# Patient Record
Sex: Male | Born: 1960 | Race: Black or African American | Hispanic: No | Marital: Married | Smoking: Current some day smoker
Health system: Southern US, Community
[De-identification: ages and names within clinical notes are randomized; demographics above are authoritative.]

## PROBLEM LIST (undated history)

## (undated) DIAGNOSIS — E119 Type 2 diabetes mellitus without complications: Secondary | ICD-10-CM

## (undated) DIAGNOSIS — E785 Hyperlipidemia, unspecified: Secondary | ICD-10-CM

## (undated) DIAGNOSIS — G4730 Sleep apnea, unspecified: Secondary | ICD-10-CM

## (undated) DIAGNOSIS — I10 Essential (primary) hypertension: Secondary | ICD-10-CM

## (undated) HISTORY — DX: Essential (primary) hypertension: I10

## (undated) HISTORY — DX: Sleep apnea, unspecified: G47.30

## (undated) HISTORY — DX: Type 2 diabetes mellitus without complications: E11.9

## (undated) HISTORY — DX: Hyperlipidemia, unspecified: E78.5

## (undated) HISTORY — PX: KNEE SURGERY: SHX244

---

## 2013-02-25 ENCOUNTER — Encounter: Payer: Self-pay | Admitting: Pulmonary Disease

## 2013-02-26 ENCOUNTER — Encounter: Payer: Self-pay | Admitting: Pulmonary Disease

## 2013-02-26 ENCOUNTER — Ambulatory Visit (INDEPENDENT_AMBULATORY_CARE_PROVIDER_SITE_OTHER): Payer: BC Managed Care – PPO | Admitting: Pulmonary Disease

## 2013-02-26 VITALS — BP 136/78 | HR 60 | Temp 98.0°F | Ht 74.0 in | Wt 240.4 lb

## 2013-02-26 DIAGNOSIS — G4733 Obstructive sleep apnea (adult) (pediatric): Secondary | ICD-10-CM

## 2013-02-26 NOTE — Progress Notes (Signed)
Subjective:    Patient ID: Joshua Barker, male    DOB: May 14, 1961, 52 y.o.   MRN: [id number]  HPI  The patient is a 52 year old male who I've been asked to see for obstructive sleep apnea.  He has recently had a home sleep test over 2 nights, and was found to have an AHI of 66 per hour on 91 and 35 per hour on night two.  However, he only had him lie sleep time of 138 minutes on the first night, and 82 minutes on the second night.  The patient states he has intermittent snoring, but no one has ever commented on apnea during the night.  He denies any choking arousal, but does not feel that he is rested in the mornings upon arising.  He denies significant sleep pressure during the day except when he has had to get up early in the mornings.  He does note sleepiness with reading.  He also has issues with sleepiness in the evening watching television, and can actually fall asleep fairly frequently.  He denies any sleepiness driving except on long distances.  Of note, the patient's weight is down 30 pounds over the last few years, and his Epworth score today is 10.  Sleep Questionnaire What time do you typically go to bed?( Between what hours) 11P-1AM 11P-1AM at 0924 on 02/26/13 by Rhunette Croft, CMA How long does it take you to fall asleep? WITHIN MINUTES WITHIN MINUTES at 0924 on 02/26/13 by Rhunette Croft, CMA How many times during the night do you wake up? 2 2 at 0924 on 02/26/13 by Rhunette Croft, CMA What time do you get out of bed to start your day? 0630 0630 at 0924 on 02/26/13 by Rhunette Croft, CMA Do you drive or operate heavy machinery in your occupation? No No at 0924 on 02/26/13 by Rhunette Croft, CMA How much has your weight changed (up or down) over the past two years? (In pounds) 30 lb (13.608 kg)30 lb (13.608 kg) DECREASE at 0924 on 02/26/13 by Rhunette Croft, CMA Have you ever had a sleep study before? Yes Yes at 0924 on 02/26/13 by Rhunette Croft,  CMA If yes, [facility] of study[facility] at 0924 on 02/26/13 by Rhunette Croft, CMA If yes, date of study? 01/21/13 01/21/13 at 0924 on 02/26/13 by Rhunette Croft, CMA Do you currently use CPAP? No No at 0924 on 02/26/13 by Rhunette Croft, CMA Do you wear oxygen at any time? No No at 0924 on 02/26/13 by Rhunette Croft, CMA   Review of Systems  Constitutional: Negative for fever and unexpected weight change.  HENT: Negative for ear pain, nosebleeds, congestion, sore throat, rhinorrhea, sneezing, trouble swallowing, dental problem, postnasal drip and sinus pressure.   Eyes: Negative for redness and itching.  Respiratory: Negative for cough, chest tightness, shortness of breath and wheezing.   Cardiovascular: Negative for palpitations and leg swelling.  Gastrointestinal: Negative for nausea and vomiting.       Acid heartburn  Genitourinary: Negative for dysuria.  Musculoskeletal: Negative for joint swelling.  Skin: Negative for rash.  Neurological: Negative for headaches.  Hematological: Does not bruise/bleed easily.  Psychiatric/Behavioral: Negative for dysphoric mood. The patient is not nervous/anxious.        Objective:   Physical Exam Constitutional:  Well developed, no acute distress  HENT:  Nares patent without discharge  Oropharynx without exudate, palate and uvula are moderately elongated.  Eyes:  Perrla, eomi, no scleral icterus  Neck:  No JVD, no TMG  Cardiovascular:  Normal rate, regular rhythm, no rubs or gallops.  No murmurs        Intact distal pulses  Pulmonary :  Normal breath sounds, no stridor or respiratory distress   No rales, rhonchi, or wheezing  Abdominal:  Soft, nondistended, bowel sounds present.  No tenderness noted.   Musculoskeletal:  No lower extremity edema noted.  Lymph Nodes:  No cervical lymphadenopathy noted  Skin:  No cyanosis noted  Neurologic:  Alert, appropriate, moves all 4 extremities without  obvious deficit.         Assessment & Plan:

## 2013-02-26 NOTE — Patient Instructions (Addendum)
You need to decide about a trial of weight loss for the next 6mos versus trying cpap while trying to work on the weight reduction.   Please call and let me know what you decide.

## 2013-02-26 NOTE — Assessment & Plan Note (Signed)
The patient has moderate to severe obstructive sleep apnea by history recent sleep test, however he did have a short sleep monitoring time.  I have had a long discussion with him about sleep apnea, including its impacted his quality of life and cardiovascular health.  The patient does not feel that he is overly symptomatic at night or during the day, but does admit that he is not rested in the mornings upon arising.  He has lost 30 pounds, and is committed to losing more.  He could possibly take a conservative path and just work on further weight loss over the next 6 months.  However, if he has not lost significant weight by that time, he should consider more aggressive treatment.  The other option is to treat this aggressively from the start, and I would recommend a trial of CPAP if he wanted to do this.  The patient would like to go home and think about his various options, and will get back with me in a short time

## 2013-08-11 ENCOUNTER — Encounter: Payer: Self-pay | Admitting: Podiatry

## 2013-08-11 ENCOUNTER — Ambulatory Visit (INDEPENDENT_AMBULATORY_CARE_PROVIDER_SITE_OTHER): Payer: BLUE CROSS/BLUE SHIELD | Admitting: Podiatry

## 2013-08-11 VITALS — BP 140/104 | HR 70 | Ht 74.0 in | Wt 213.0 lb

## 2013-08-11 DIAGNOSIS — M2041 Other hammer toe(s) (acquired), right foot: Secondary | ICD-10-CM | POA: Insufficient documentation

## 2013-08-11 DIAGNOSIS — M79674 Pain in right toe(s): Secondary | ICD-10-CM

## 2013-08-11 DIAGNOSIS — Q828 Other specified congenital malformations of skin: Secondary | ICD-10-CM

## 2013-08-11 DIAGNOSIS — M79609 Pain in unspecified limb: Secondary | ICD-10-CM | POA: Diagnosis not present

## 2013-08-11 DIAGNOSIS — M2040 Other hammer toe(s) (acquired), unspecified foot: Secondary | ICD-10-CM | POA: Diagnosis not present

## 2013-08-11 DIAGNOSIS — L84 Corns and callosities: Secondary | ICD-10-CM | POA: Insufficient documentation

## 2013-08-11 NOTE — Patient Instructions (Addendum)
Seen for digital corn on 5th and 4th right at contact surface.  Debride lesions.  May benefit from removal of bone spur from 5th digit at the office under local anesthetics.  Come in with open pair of shoes for the surgery.

## 2013-08-11 NOTE — Progress Notes (Signed)
Patient came in to schedule surgery and another trimming of callus. Digital corn on 5th and 4th right at contact surface are very painful to walk. 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  Assessment: Bone spur distal medial 5th digit right with digital corn on contact surface of 4th and 5th digit right. All other findings are within normal.  Plan: Debrided all lesions.  May benefit from removal of bone spur from 5th digit at the office under local anesthetics.  Patient will come in with open pair of shoes for the surgery.

## 2014-12-31 ENCOUNTER — Ambulatory Visit: Payer: Self-pay | Admitting: Pulmonary Disease

## 2015-12-05 ENCOUNTER — Encounter: Payer: Self-pay | Admitting: Emergency Medicine

## 2015-12-05 ENCOUNTER — Ambulatory Visit (HOSPITAL_BASED_OUTPATIENT_CLINIC_OR_DEPARTMENT_OTHER)
Admission: RE | Admit: 2015-12-05 | Discharge: 2015-12-05 | Disposition: A | Discharge status: Home / Self Care | Payer: Managed Care, Other (non HMO) | Source: Ambulatory Visit | Attending: Family Medicine | Admitting: Family Medicine

## 2015-12-05 ENCOUNTER — Emergency Department (INDEPENDENT_AMBULATORY_CARE_PROVIDER_SITE_OTHER)
Admission: EM | Admit: 2015-12-05 | Discharge: 2015-12-05 | Disposition: A | Discharge status: Home / Self Care | Payer: Managed Care, Other (non HMO) | Source: Home / Self Care | Attending: Family Medicine | Admitting: Family Medicine

## 2015-12-05 DIAGNOSIS — M79671 Pain in right foot: Secondary | ICD-10-CM | POA: Insufficient documentation

## 2015-12-05 DIAGNOSIS — M7989 Other specified soft tissue disorders: Secondary | ICD-10-CM | POA: Diagnosis not present

## 2015-12-05 DIAGNOSIS — IMO0001 Reserved for inherently not codable concepts without codable children: Secondary | ICD-10-CM

## 2015-12-05 DIAGNOSIS — M79674 Pain in right toe(s): Secondary | ICD-10-CM

## 2015-12-05 DIAGNOSIS — R030 Elevated blood-pressure reading, without diagnosis of hypertension: Secondary | ICD-10-CM

## 2015-12-05 MED ORDER — INDOMETHACIN 25 MG PO CAPS: 25.0000 mg | ORAL_CAPSULE | Freq: Three times a day (TID) | ORAL | Status: DC | PRN

## 2015-12-05 NOTE — ED Provider Notes (Signed)
CSN: [id number]     Arrival date & time 12/05/15  1128 History   None    Chief Complaint  Patient presents with  . Foot Pain   (Consider location/radiation/quality/duration/timing/severity/associated sxs/prior Treatment) HPI Pt is a 55yo male presenting to [facility] with c/o Right foot pain and swelling for about 5 days.  Pt reports hx of similar symptoms in Left and Right foot intermittently but states the pain and swelling typically only lasts 1-2 days at a time. He denies known injuries but states he wears insoles in his shoes due to having high arches and does not always remember to transfer them from one pair of shoes to the other.  Pt questions if he has gout but denies hx of gout. Pain is aching, throbbing, burning, 6/10 at this time, worse with palpation and ambulation.  Pt is also concerned for a blood clot as a friend of his died from a blood clot that traveled from his leg after a basketball injury.  Pt denies prior hx of blood clots.   Pt's BP elevated in triage.  He is a diabetic and hx of HTN. He takes all his medications daily and notes his CBG and BP is typically well controlled. His BP  Is elevated today due to taking OTC cough/cold medication recently. Pt states his BP has been better at home since onset of his foot swelling and is not concerned about the high reading today in UC.  Denies chest pain or SOB. Denies headache or dizziness.  Denies taking any fluid pills.    Past Medical History  Diagnosis Date  . DM (diabetes mellitus) (HCC)   . Hypertension   . Hyperlipidemia   . Sleep apnea    Past Surgical History  Procedure Laterality Date  . Knee surgery     Family History  Problem Relation Age of Onset  . Adopted: Yes  . Other      UNKNOWN FAMILY HISTORY D/T BEING ADOPTED   Social History  Substance Use Topics  . Smoking status: Current Some Day Smoker    Types: Cigars  . Smokeless tobacco: Never Used     Comment: only smoke 2 cigars in a month  . Alcohol Use: Yes      Comment: occasional use    Review of Systems  Constitutional: Negative for fever and chills.  Respiratory: Negative for cough, chest tightness and shortness of breath.   Cardiovascular: Positive for leg swelling. Negative for chest pain and palpitations.  Gastrointestinal: Negative for nausea, vomiting, abdominal pain and diarrhea.  Musculoskeletal: Positive for myalgias, joint swelling and arthralgias. Negative for gait problem.       Right foot and ankle pain  Skin: Negative for color change and wound.  Neurological: Negative for dizziness, syncope, light-headedness and headaches.    Allergies  Review of patient's allergies indicates not on file.  Home Medications   Prior to Admission medications   Medication Sig Start Date End Date Taking? Authorizing Provider  insulin aspart protamine - aspart (NOVOLOG 70/30 MIX) (70-30) 100 UNIT/ML FlexPen Inject into the skin 2 (two) times daily.   Yes Historical Provider, MD  amLODipine (NORVASC) 10 MG tablet Take 10 mg by mouth daily.    Historical Provider, MD  aspirin 81 MG tablet Take 81 mg by mouth daily.    Historical Provider, MD  Azilsartan-Chlorthalidone 40-25 MG TABS Take 1 tablet by mouth daily.    Historical Provider, MD  glipiZIDE-metformin (METAGLIP) 5-500 MG per tablet Take 1 tablet by  mouth daily.    Historical Provider, MD  indomethacin (INDOCIN) 25 MG capsule Take 1 capsule (25 mg total) by mouth 3 (three) times daily as needed. 12/05/15   Junius FinnerErin O'Malley, PA-C  Nebivolol HCl (BYSTOLIC) 20 MG TABS Take 1 tablet by mouth daily.    Historical Provider, MD   Meds Ordered and Administered this Visit  Medications - No data to display  BP 207/90 mmHg  Pulse 60  Temp(Src) 98.5 F (36.9 C) (Oral)  Ht 6\' 2"  (1.88 m)  Wt 235 lb (106.595 kg)  BMI 30.16 kg/m2  SpO2 97% No data found.   Physical Exam  Constitutional: He is oriented to person, place, and time. He appears well-developed and well-nourished.  HENT:  Head:  Normocephalic and atraumatic.  Eyes: EOM are normal.  Neck: Normal range of motion.  Cardiovascular: Normal rate and regular rhythm.   Pulses:      Dorsalis pedis pulses are 2+ on the right side.  Pulmonary/Chest: Effort normal. No respiratory distress.  Musculoskeletal: Normal range of motion. He exhibits edema and tenderness.  Right ankle and foot: moderate circumfrential edema to ankle and foot.  Tenderness to medial and lateral malleolus. Mild tenderness to dorsum of foot.  Full ROM ankle and toes Right calf- soft, non-tender. No obvious edema.   Neurological: He is alert and oriented to person, place, and time.  Right foot: normal sensation to light touch  Skin: Skin is warm and dry. No erythema.  Right lower leg, ankle and foot: skin in tact. No ecchymosis, erythema or warmth  Psychiatric: He has a normal mood and affect. His behavior is normal.  Nursing note and vitals reviewed.   ED Course  Procedures (including critical care time)  Labs Review Labs Reviewed - No data to display  Imaging Review [place] Venous Img Lower Unilateral Right  12/05/2015  CLINICAL DATA:  Right foot pain, swelling, redness , radiating up Right lower leg EXAM: RIGHT LOWER EXTREMITY VENOUS DOPPLER ULTRASOUND TECHNIQUE: Gray-scale sonography with compression, as well as color and duplex ultrasound, were performed to evaluate the deep venous system from the level of the common femoral vein through the popliteal and proximal calf veins. COMPARISON:  None FINDINGS: Normal compressibility of the common femoral, superficial femoral, and popliteal veins, as well as the proximal calf veins. No filling defects to suggest DVT on grayscale or color Doppler imaging. Doppler waveforms show normal direction of venous flow, normal respiratory phasicity and response to augmentation. Visualized segments of the saphenous venous system normal in caliber and compressibility. Survey views of the contralateral common femoral vein are  unremarkable. IMPRESSION: 1. No evidence of lower extremity deep vein thrombosis, RIGHT. Electronically Signed   By: Corlis Leak  Hassell M.D.   On: 12/05/2015 13:49      MDM   1. Pain and swelling of toe of right foot   2. Elevated blood pressure    Pt c/o Right foot and ankle pain with swelling.  This has occurred previously but has persisted longer than usual. No known injury. Pt concerned for gout vs blood clot. No prior hx of either.  Right foot: PMS in tact.  No evidence of underlying infect. Low concern for gout given no tenderness with light touch. No erythema or warmth.  [facility]S unavailable at this facility [facility] Pt sent via POV to [facility] for U/S at 13:30 this afternoon. Pt will be notified over phone of results and tx options.  1:59 PM U/S is negative for DVT.  Discussed results with pt over the phone. Encouraged pt to take indomethacin or 600-800mg  ibuprofen every 6-8 hours for pain and swelling. F/u with PCP in 1 week for recheck of symptoms if not improving. Patient verbalized understanding and agreement with treatment plan.   [facility]  Pt reports systolic BP in 160s this morning and attributes elevated BP in UC to taking OTC cough/cold medications as well as being nervous about possible blood clot in foot.  Pt does not want further workup of elevated BP at this time.  Denies CP, SOB, headache or dizziness. Encouraged to f/u with PCP for BP recheck. Pt verbalized understanding.    Junius Finner, PA-C 12/05/15 1400

## 2015-12-05 NOTE — ED Notes (Signed)
Right foot pain x 5 days, chronic problem, he has not been wearing his shoe inserts and his foot started hurting it usually doesn't last this long or this severe.

## 2015-12-05 NOTE — Discharge Instructions (Signed)
Edema °Edema is an abnormal buildup of fluids. It is more common in your legs and thighs. Painless swelling of the feet and ankles is more likely as a person ages. It also is common in looser skin, like around your eyes. °HOME CARE  °· Keep the affected body part above the level of the heart while lying down. °· Do not sit still or stand for a long time. °· Do not put anything right under your knees when you lie down. °· Do not wear tight clothes on your upper legs. °· Exercise your legs to help the puffiness (swelling) go down. °· Wear elastic bandages or support stockings as told by your doctor. °· A low-salt diet may help lessen the puffiness. °· Only take medicine as told by your doctor. °GET HELP IF: °· Treatment is not working. °· You have heart, liver, or kidney disease and notice that your skin looks puffy or shiny. °· You have puffiness in your legs that does not get better when you raise your legs. °· You have sudden weight gain for no reason. °GET HELP RIGHT AWAY IF:  °· You have shortness of breath or chest pain. °· You cannot breathe when you lie down. °· You have pain, redness, or warmth in the areas that are puffy. °· You have heart, liver, or kidney disease and get edema all of a sudden. °· You have a fever and your symptoms get worse all of a sudden. °MAKE SURE YOU:  °· Understand these instructions. °· Will watch your condition. °· Will get help right away if you are not doing well or get worse. °  °This information is not intended to replace advice given to you by your health care provider. Make sure you discuss any questions you have with your health care provider. °  °Document Released: 04/30/2008 Document Revised: 11/17/2013 Document Reviewed: 09/04/2013 °Elsevier Interactive Patient Education ©2016 [employer] ° °

## 2016-03-13 ENCOUNTER — Ambulatory Visit: Payer: BLUE CROSS/BLUE SHIELD | Admitting: Podiatry

## 2016-03-14 ENCOUNTER — Encounter: Payer: Self-pay | Admitting: Podiatry

## 2016-03-14 ENCOUNTER — Ambulatory Visit (INDEPENDENT_AMBULATORY_CARE_PROVIDER_SITE_OTHER): Payer: Managed Care, Other (non HMO) | Admitting: Podiatry

## 2016-03-14 VITALS — BP 161/83 | HR 54

## 2016-03-14 DIAGNOSIS — M79673 Pain in unspecified foot: Secondary | ICD-10-CM | POA: Diagnosis not present

## 2016-03-14 DIAGNOSIS — M79604 Pain in right leg: Secondary | ICD-10-CM

## 2016-03-14 DIAGNOSIS — M21969 Unspecified acquired deformity of unspecified lower leg: Secondary | ICD-10-CM

## 2016-03-14 DIAGNOSIS — M216X9 Other acquired deformities of unspecified foot: Secondary | ICD-10-CM

## 2016-03-14 DIAGNOSIS — M216X1 Other acquired deformities of right foot: Secondary | ICD-10-CM

## 2016-03-14 DIAGNOSIS — M659 Synovitis and tenosynovitis, unspecified: Secondary | ICD-10-CM

## 2016-03-14 DIAGNOSIS — M216X2 Other acquired deformities of left foot: Secondary | ICD-10-CM

## 2016-03-14 NOTE — Patient Instructions (Signed)
Seen for pain in both ankles. Noted of high arched foot with weakened first metatarsal bone and tight Achilles tendon. Need daily stretch exercise and need custom orthotics.  Return for orthotics.

## 2016-03-14 NOTE — Progress Notes (Signed)
Subjective: 55 year old male presents complaining of tenderness on top and bottom on both feet R>L duration of over a month. Pain and swelling started medial aspect of the lateral malleoli and advanced to medial direction on right ankle.  Last month he went to ER for this swollen foot on right. Was treated with NSAIA. Problem subsided after 2 weeks.  Blood sugar this morning was 125. Not on feet much. Office work and drives a lot.  Has had previous orthotics two years ago. They are worn out.  Objective: Neurovascular status are within normal. Dermatologic: Normal skin without abnormal skin lesions.  Orthopedic: High arched Cavus foot, tight Achilles tendon R>L, excess sagittal plane motion first ray bilateral. Pain at anterior portion of lateral malleoli area right foot.   Assessment: Tenosynovitis right ankle. 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 Hypermobile first ray bilateral. Ankle equinus R>L. Pain with ambulation R>L.  Plan: Reviewed clinical findings and available treatment options. Reviewed stretch exercise for tight Achilles tendon. Patient is to practice daily.  Night Splint dispensed with instruction. Return for a new pair of custom orthotics.

## 2016-03-20 ENCOUNTER — Ambulatory Visit: Payer: Managed Care, Other (non HMO) | Admitting: Podiatry

## 2016-06-04 ENCOUNTER — Emergency Department (INDEPENDENT_AMBULATORY_CARE_PROVIDER_SITE_OTHER)
Admission: EM | Admit: 2016-06-04 | Discharge: 2016-06-04 | Disposition: A | Discharge status: Home / Self Care | Payer: Managed Care, Other (non HMO) | Source: Home / Self Care | Attending: Family Medicine | Admitting: Family Medicine

## 2016-06-04 ENCOUNTER — Encounter: Payer: Self-pay | Admitting: *Deleted

## 2016-06-04 DIAGNOSIS — M7021 Olecranon bursitis, right elbow: Secondary | ICD-10-CM

## 2016-06-04 MED ORDER — TRAMADOL HCL 50 MG PO TABS: 50.0000 mg | ORAL_TABLET | Freq: Four times a day (QID) | ORAL | Status: DC | PRN

## 2016-06-04 MED ORDER — CLINDAMYCIN HCL 300 MG PO CAPS: 300.0000 mg | ORAL_CAPSULE | Freq: Three times a day (TID) | ORAL | Status: DC

## 2016-06-04 NOTE — ED Notes (Signed)
Pt c/o RT elbow swelling and pain x last night. Denies injury. He took Advil 400mg  at 0900 today.

## 2016-06-04 NOTE — Discharge Instructions (Signed)
Tramadol is strong pain medication. While taking, do not drink alcohol, drive, or perform any other activities that requires focus while taking these medications.   Please take antibiotics as prescribed and be sure to complete entire course even if you start to feel better to ensure infection does not come back.    Olecranon Bursitis With Rehab A bursa is a fluid-filled sac that is located between soft tissues (ligaments, tendons, skin) and bones. The purpose of a bursa is to allow the soft tissue to function smoothly, without friction. The olecranon bursa is located between the back of the elbow (olecranon) and the skin. Olecranon bursitis involves inflammation of this bursa, resulting in pain. SYMPTOMS   Pain, tenderness, swelling, warmth, or redness over the back of the elbow.  Reduced range of motion of the affected elbow.  Sometimes, severe pain with movement of the affected elbow.  Crackling sound (crepitation) when the bursa is moved or touched.  Often, painless swelling of the bursa.  Fever (when infected). CAUSES  Olecranon bursitis is often caused by direct hit (trauma) to the elbow. Less commonly, it is due to overuse and/or strenuous exercise that the elbow is not used to. RISK INCREASES WITH:  Sports that require bending or landing on the elbow (football, volleyball).  Vigorous or repetitive athletic training, or sudden increase or change in activity level (weekend warriors).  Failure to warm up properly before activity.  Poor exercise technique.  Playing on artificial turf. PREVENTION  Avoid injuries and the overuse of muscles whenever possible.  Warm up and stretch properly before activity.  Allow for adequate recovery between workouts.  Maintain physical fitness:  Strength, flexibility, and endurance.  Cardiovascular fitness.  Learn and use proper technique.  Wear properly fitted and padded protective equipment. PROGNOSIS  If treated properly,  olecranon bursitis is usually curable within 2 weeks.  RELATED COMPLICATIONS   Longer healing time, if not properly treated or if not given enough time to heal.  Recurring symptoms that result in a chronic problem.  Joint stiffness with permanent limitation of the affected joint's movement.  Infection of the bursa.  Chronic inflammation or scarring of the bursa. TREATMENT Treatment first involves the use of ice and medicine to reduce pain and inflammation. The use of strengthening and stretching exercises may help reduce pain with activity. These exercises may be performed at home or with a therapist. Elbow pads may be advised to protect the bursa. If symptoms persist despite nonsurgical treatment, a procedure to withdraw fluid from the bursa may be advised. This procedure may be accompanied with an injection of corticosteroids to reduce inflammation. Sometimes, surgery is needed to remove the bursa. MEDICATION  If pain medicine is needed, nonsteroidal anti-inflammatory medicines (aspirin and ibuprofen), or other minor pain relievers (acetaminophen), are often advised.  Do not take pain medicine for 7 days before surgery.  Prescription pain relievers may be given, if your caregiver thinks they are needed. Use only as directed and only as much as you need.  Corticosteroid injections may be given by your caregiver. These injections should be reserved for the most serious cases, because they may only be given a certain number of times. HEAT AND COLD  Cold treatment (icing) should be applied for 10 to 15 minutes every 2 to 3 hours for inflammation and pain, and immediately after activity that aggravates your symptoms. Use ice packs or an ice massage.  Heat treatment may be used before performing stretching and strengthening activities prescribed by your caregiver,  physical therapist, or athletic trainer. Use a heat pack or a warm water soak. SEEK IMMEDIATE MEDICAL CARE IF:   Symptoms get  worse or do not improve in 2 weeks, despite treatment.  Signs of infection develop, including fever of 102 F (38.9 C), increased pain, redness, warmth, or pus draining from the bursa.  New, unexplained symptoms develop. (Drugs used in treatment may produce side effects.) EXERCISES  RANGE OF MOTION (ROM) AND STRETCHING EXERCISES - Olecranon Bursitis These exercises may help you when beginning to rehabilitate your injury. Your symptoms may resolve with or without further involvement from your physician, physical therapist or athletic trainer. While completing these exercises, remember:   Restoring tissue flexibility helps normal motion to return to the joints. This allows healthier, less painful movement and activity.  An effective stretch should be held for at least 30 seconds.  A stretch should never be painful. You should only feel a gentle lengthening or release in the stretched tissue. RANGE OF MOTION - Elbow Flexion, Supine  Lie on your back. Extend your right / left arm into the air, bracing it with your opposite hand. Allow your right / left arm to relax.  Let your elbow bend, allowing your hand to fall slowly toward your chest.  You should feel a gentle stretch along the back of your upper arm and elbow. Your physician, physical therapist or athletic trainer may ask you to hold a __________ hand weight to increase the intensity of this stretch.  Hold for __________ seconds. Slowly return your right / left arm to the upright position. Repeat __________ times. Complete this exercise __________ times per day. STRETCH - Elbow Flexors  Lie on a firm bed or countertop on your back. Be sure that you are in a comfortable position which will allow you to relax your arm muscles.  Place a folded towel under your right / left upper arm, so that your elbow and shoulder are at the same height. Extend your arm; your elbow should not rest on the bed or towel  Allow the weight of your hand to  straighten your elbow. Keep your arm and chest muscles relaxed. Your caregiver may ask you to increase the intensity of your stretch by adding a small wrist or hand weight.  Hold for __________ seconds. You should feel a stretch on the inside of your elbow. Slowly return to the starting position. Repeat __________ times. Complete this exercise __________ times per day. STRENGTHENING EXERCISES - Olecranon Bursitis These exercises will help you regain your strength. They may resolve your symptoms with or without further involvement from your physician, physical therapist or athletic trainer. While completing these exercises, remember:   Muscles can gain both the endurance and the strength needed for everyday activities through controlled exercises.  Complete these exercises as instructed by your physician, physical therapist or athletic trainer. Increase the resistance and repetitions only as guided by your caregiver.  You may experience muscle soreness or fatigue, but the pain or discomfort you are trying to eliminate should never worsen during these exercises. If this pain does worsen, stop and make certain you are following the directions exactly. If the pain is still present after adjustments, discontinue the exercise until you can discuss the trouble with your caregiver. STRENGTH - Elbow Extensors, Isometric  Stand or sit upright on a firm surface. Place your right / left arm so that your palm faces your stomach, and it is at the height of your waist.  Place your opposite hand  on the underside of your forearm. Gently push up as your right / left arm resists. Push as hard as you can with both arms without causing any pain or movement at your right / left elbow. Hold this stationary position for __________ seconds.  Gradually release the tension in both arms. Allow your muscles to relax completely before repeating. Repeat __________ times. Complete this exercise __________ times per  day. STRENGTH - Elbow Flexors, Isometric  Stand or sit upright on a firm surface. Place your right / left arm so that your hand is palm-up and at the height of your waist.  Place your opposite hand on top of your forearm. Gently push down as your right / left arm resists. Push as hard as you can with both arms without causing any pain or movement at your right / left elbow. Hold this stationary position for __________ seconds.  Gradually release the tension in both arms. Allow your muscles to relax completely before repeating. Repeat __________ times. Complete this exercise __________ times per day. STRENGTH - Elbow Flexors, Supinated  With good posture, stand or sit on a firm chair without armrests. Allow your right / left arm to rest at your side with your palm facing forward.  Holding a __________ weight, or gripping a rubber exercise band or tubing,  bring your hand toward your shoulder.  Allow your muscles to control the resistance as your hand returns to your side. Repeat __________ times. Complete this exercise __________ times per day.  STRENGTH - Elbow Flexors, Neutral  With good posture, stand or sit on a firm chair without armrests. Allow your right / left arm to rest at your side with your thumb facing forward.  Holding a __________weight, or gripping a rubber exercise band or tubing,  bring your hand toward your shoulder.  Allow your muscles to control the resistance as your hand returns to your side. Repeat __________ times. Complete this exercise __________ times per day.  STRENGTH - Elbow Extensors  Lie on your back. Extend your right / left elbow into the air, pointing it toward the ceiling. Brace your arm with your opposite hand.*  Holding a __________ weight in your hand, slowly straighten your right / left elbow.  Allow your muscles to control the weight as your hand returns to its starting position. Repeat __________ times. Complete this exercise __________  times per day. *You may also stand with your elbow overhead and pointed toward the ceiling, supported by your opposite hand. STRENGTH - Elbow Extensors, Dynamic  With good posture, stand, or sit on a firm chair without armrests. Keeping your upper arms at your side, bring both hands up to your right / left shoulder while gripping a rubber exercise band or tubing. Your right / left hand should be just below the other hand.  Straighten your right / left elbow. Hold for __________ seconds.  Allow your muscles to control the rubber exercise band, as your hand returns to your shoulder. Repeat __________ times. Complete this exercise __________ times per day.   This information is not intended to replace advice given to you by your health care provider. Make sure you discuss any questions you have with your health care provider.   Document Released: 11/12/2005 Document Revised: 03/29/2015 Document Reviewed: 02/24/2009 Else[employer] Interactive Patient Education [employer]

## 2016-06-04 NOTE — ED Provider Notes (Signed)
CSN: [id number]     Arrival date & time 06/04/16  1956 History   First MD Initiated Contact with Patient 06/04/16 2017     Chief Complaint  Patient presents with  . Joint Swelling   (Consider location/radiation/quality/duration/timing/severity/associated sxs/prior Treatment) HPI  Joshua Barker is a 55 y.o. male presenting to UC with c/o sudden onset, suddenly worsening Right elbow swelling and pain that started last night.  Pain is aching and sore, 7/10, worse with movement of elbow and palpation of swollen area. He took  Advil this morning around 9AM with minimal relief. Denies known injury. No hx of similar symptoms. Pt is Right hand dominant, but does not recall any specific repetitive movements with that arm.    Past Medical History  Diagnosis Date  . DM (diabetes mellitus) (HCC)   . Hypertension   . Hyperlipidemia   . Sleep apnea    Past Surgical History  Procedure Laterality Date  . Knee surgery     Family History  Problem Relation Age of Onset  . Adopted: Yes  . Other      UNKNOWN FAMILY HISTORY D/T BEING ADOPTED   Social History  Substance Use Topics  . Smoking status: Current Some Day Smoker    Types: Cigars  . Smokeless tobacco: Never Used     Comment: only smoke 2 cigars in a month  . Alcohol Use: Yes     Comment: occasional use    Review of Systems  Constitutional: Negative for fever and chills.  Gastrointestinal: Negative for nausea and vomiting.  Musculoskeletal: Positive for myalgias, joint swelling and arthralgias.  Skin: Negative for color change and wound.  Neurological: Negative for weakness and numbness.    Allergies  Review of patient's allergies indicates no known allergies.  Home Medications   Prior to Admission medications   Medication Sig Start Date End Date Taking? Authorizing Provider  TURMERIC PO Take by mouth.   Yes Historical Provider, MD  amLODipine (NORVASC) 10 MG tablet Take 10 mg by mouth daily.    Historical Provider, MD   aspirin 81 MG tablet Take 81 mg by mouth daily.    Historical Provider, MD  Azilsartan-Chlorthalidone 40-25 MG TABS Take 1 tablet by mouth daily.    Historical Provider, MD  clindamycin (CLEOCIN) 300 MG capsule Take 1 capsule (300 mg total) by mouth 3 (three) times daily. X 10 days 06/04/16   Junius Finner, PA-C  glipiZIDE-metformin (METAGLIP) 5-500 MG per tablet Take 1 tablet by mouth daily.    Historical Provider, MD  indomethacin (INDOCIN) 25 MG capsule Take 1 capsule (25 mg total) by mouth 3 (three) times daily as needed. 12/05/15   Junius Finner, PA-C  insulin aspart protamine - aspart (NOVOLOG 70/30 MIX) (70-30) 100 UNIT/ML FlexPen Inject into the skin 2 (two) times daily.    Historical Provider, MD  Nebivolol HCl (BYSTOLIC) 20 MG TABS Take 1 tablet by mouth daily.    Historical Provider, MD  traMADol (ULTRAM) 50 MG tablet Take 1 tablet (50 mg total) by mouth every 6 (six) hours as needed. 06/04/16   Junius Finner, PA-C   Meds Ordered and Administered this Visit  Medications - No data to display  BP 106/69 mmHg  Pulse 75  Temp(Src) 98.2 F (36.8 C) (Oral)  Resp 18  Ht  (1.88 m)  Wt 237 lb (107.502 kg)  BMI 30.42 kg/m2  SpO2 96% No data found.   Physical Exam  Constitutional: He is oriented to person, place, and time.  He appears well-developed and well-nourished.  HENT:  Head: Normocephalic and atraumatic.  Eyes: EOM are normal.  Neck: Normal range of motion.  Cardiovascular: Normal rate.   Pulses:      Radial pulses are 2+ on the right side.  Pulmonary/Chest: Effort normal.  Musculoskeletal: Normal range of motion. He exhibits edema and tenderness.  Right elbow: full ROM, moderate edema of olecranon bursa. Tender.   Neurological: He is alert and oriented to person, place, and time.  Skin: Skin is warm and dry. No erythema.  Right elbow: skin in tact. Pt is dark skinned, no obvious erythema or ecchymosis. Mild warmth over olecranon.   Psychiatric: He has a normal mood  and affect. His behavior is normal.  Nursing note and vitals reviewed.   ED Course  Procedures (including critical care time)  Labs Review Labs Reviewed - No data to display  Imaging Review No results found.    MDM   1. Bursitis of elbow, right    Exam c/w olecranon bursitis. Mild warmth. No obvious erythema or ecchymosis, however, due to warmth and severe pain, will cover for underlying infection.  Rx: clindamycin and tramadol. Ace wrap provided in UC, however, advised pt he may find a more comfortable elbow sleeve for compression over the counter.   Home care instructions provided. F/u in 4-5 days with Sports Medicine if not improving, sooner if symptoms continue to worsen. Patient verbalized understanding and agreement with treatment plan.     Junius FinnerErin O'Malley, PA-C 06/05/16 [phone]

## 2017-05-02 DIAGNOSIS — E785 Hyperlipidemia, unspecified: Secondary | ICD-10-CM | POA: Diagnosis not present

## 2017-05-02 DIAGNOSIS — E1122 Type 2 diabetes mellitus with diabetic chronic kidney disease: Secondary | ICD-10-CM | POA: Diagnosis not present

## 2017-05-02 DIAGNOSIS — N183 Chronic kidney disease, stage 3 (moderate): Secondary | ICD-10-CM | POA: Diagnosis not present

## 2017-05-02 DIAGNOSIS — I10 Essential (primary) hypertension: Secondary | ICD-10-CM | POA: Diagnosis not present

## 2017-11-08 IMAGING — US US EXTREM LOW VENOUS*R*
1 series · 14 of 24 positions shown · non-contrast
Comparison: None

CLINICAL DATA: Right foot pain, swelling, redness , radiating up
Right lower leg

EXAM:
RIGHT LOWER EXTREMITY VENOUS DOPPLER ULTRASOUND
TECHNIQUE: Gray-scale sonography with compression, as well as color and duplex
ultrasound, were performed to evaluate the deep venous system from
the level of the common femoral vein through the popliteal and
proximal calf veins.

[Series 1: us extrem low venous*right* · 0.08mm/px · 14 of 28 slices shown]
[im 1/28]
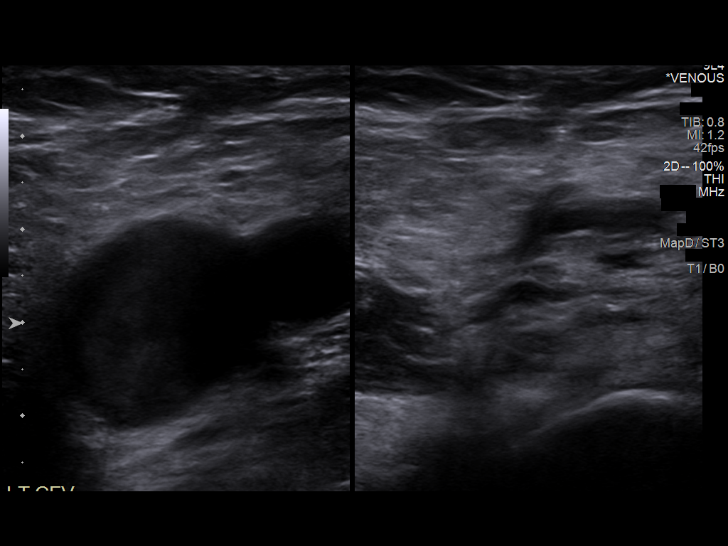
[im 3/28]
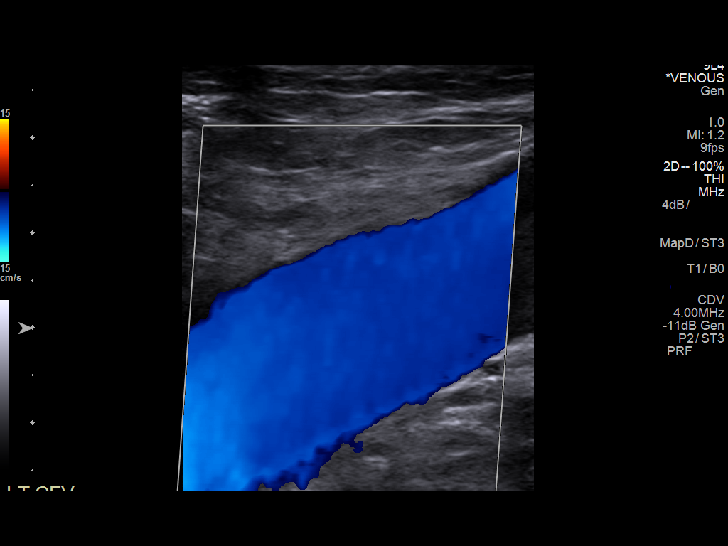
[im 5/28]
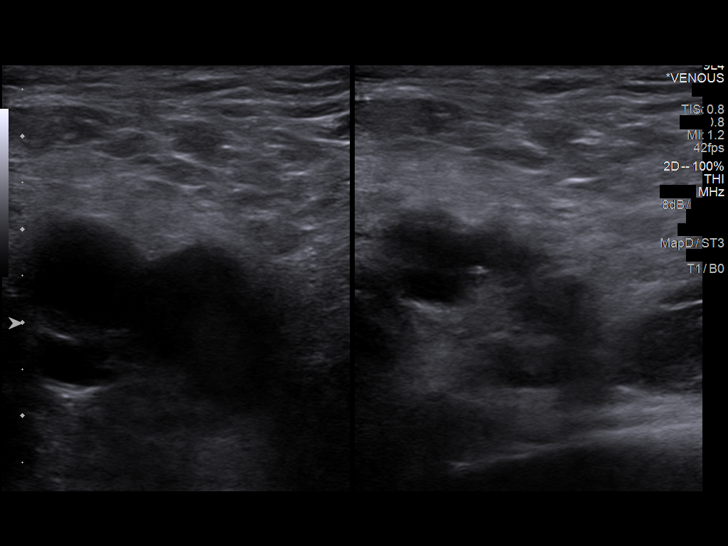
[im 8/28]
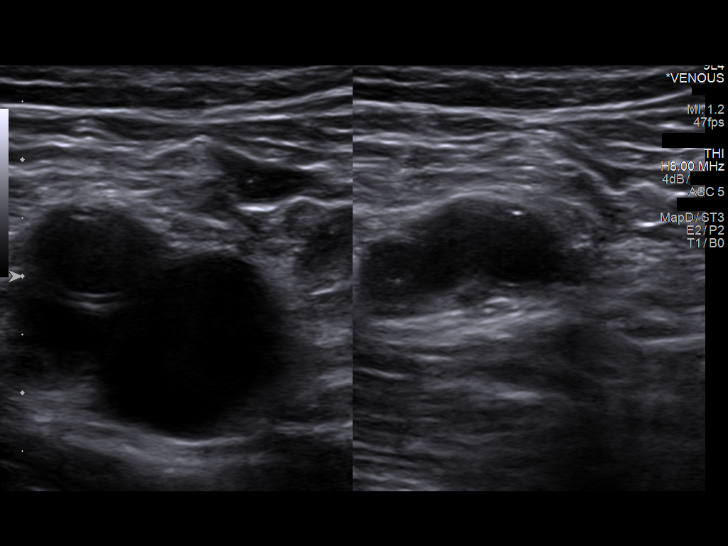
[im 9/28]
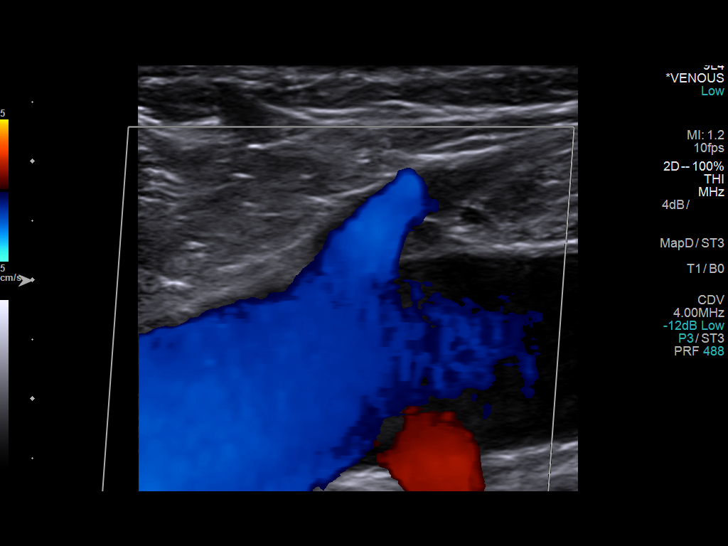
[im 11/28]
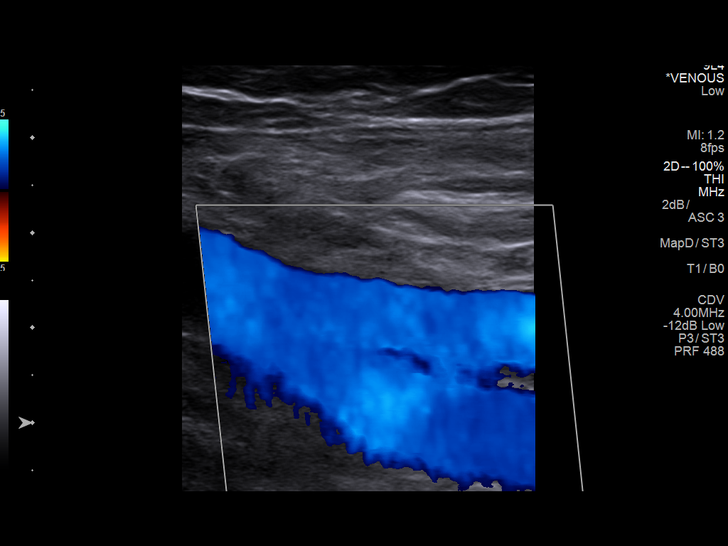
[im 13/28]
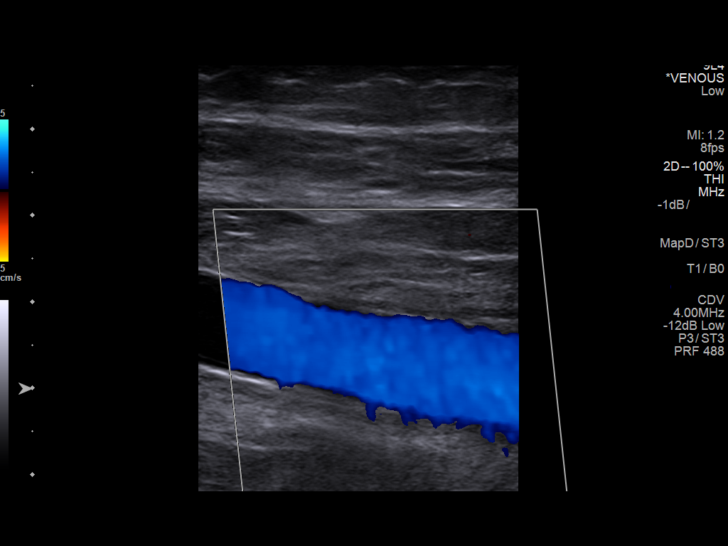
[im 15/28]
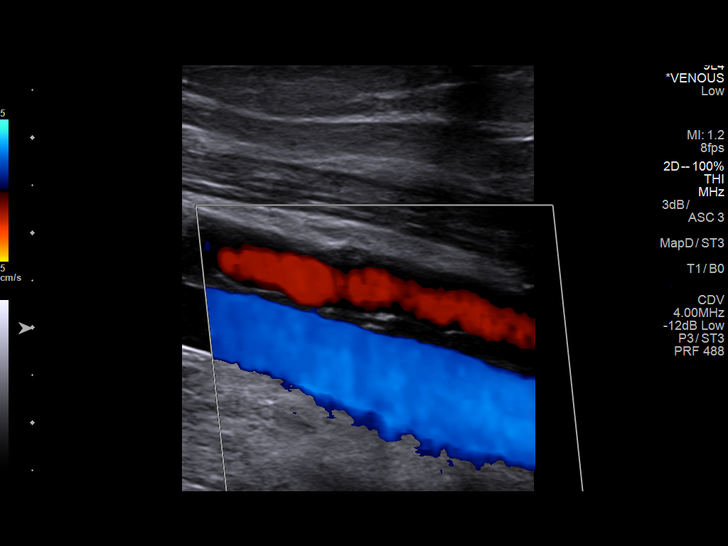
[im 17/28]
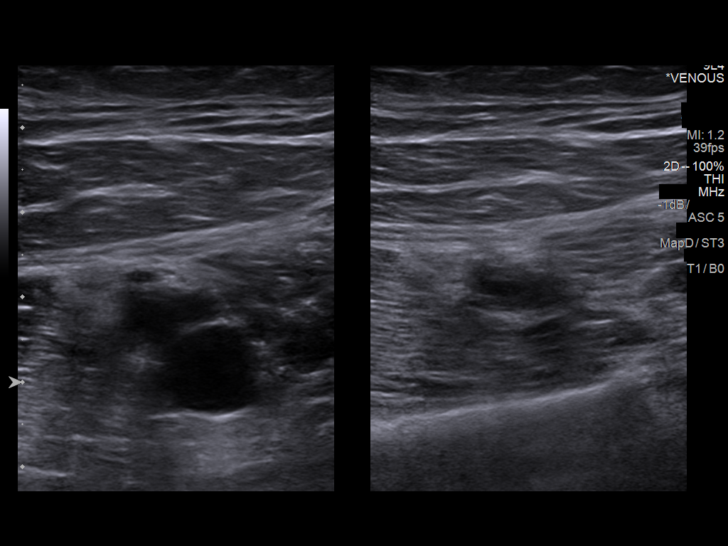
[im 19/28]
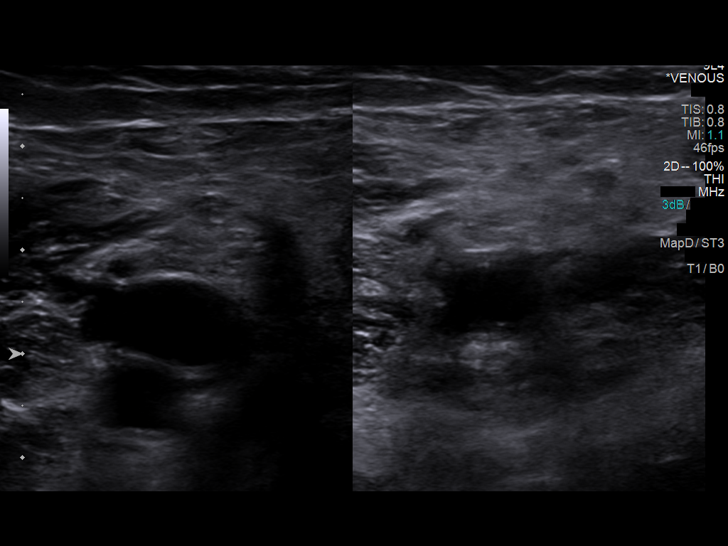
[im 22/28]
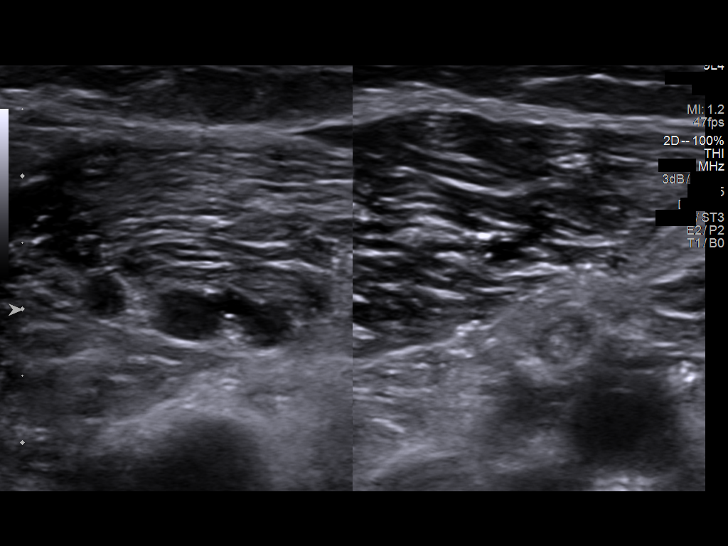
[im 23/28]
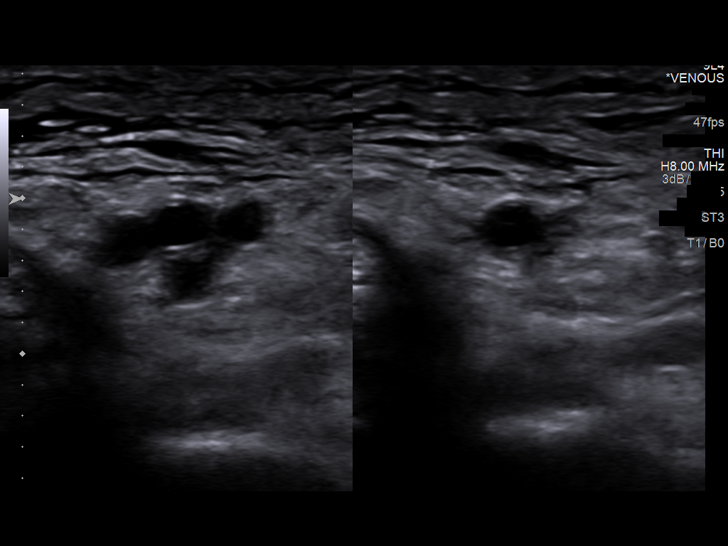
[im 25/28]
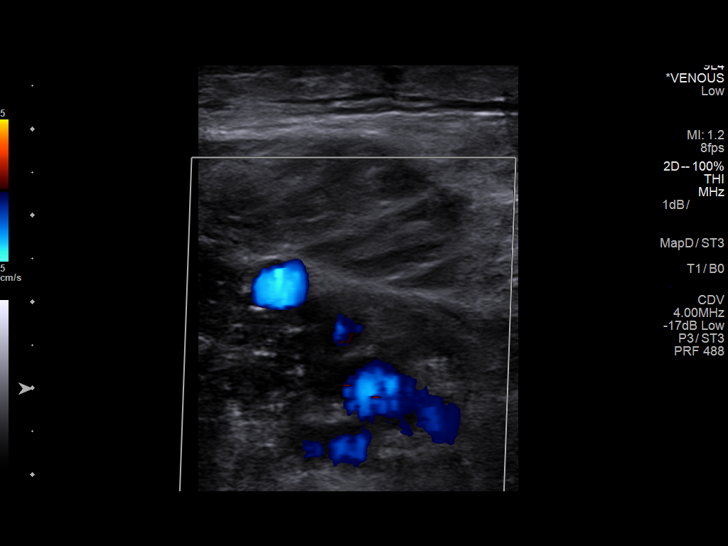
[im 28/28]
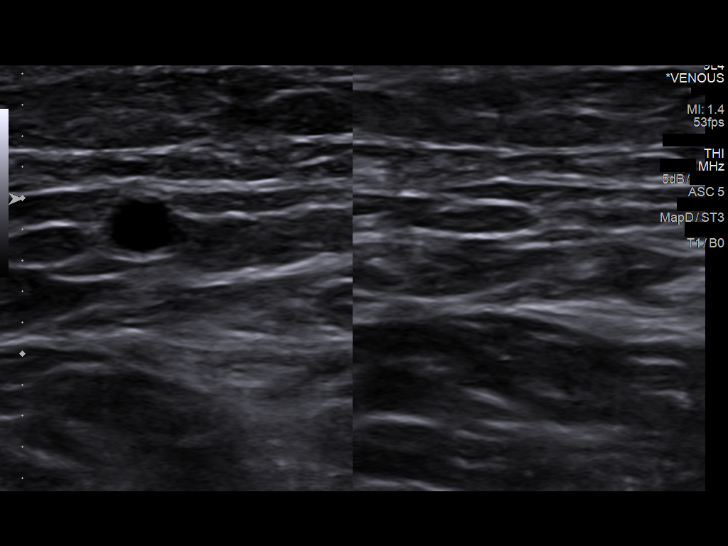

[14 of 24 positions shown; findings below may reference images not displayed]

FINDINGS: Normal compressibility of the common femoral, superficial femoral,
and popliteal veins, as well as the proximal calf veins. No filling
defects to suggest DVT on grayscale or color Doppler imaging.
Doppler waveforms show normal direction of venous flow, normal
respiratory phasicity and response to augmentation. Visualized
segments of the saphenous venous system normal in caliber and
compressibility. Survey views of the contralateral common femoral
vein are unremarkable.
IMPRESSION: 1. No evidence of lower extremity deep vein thrombosis, RIGHT.

## 2019-03-25 NOTE — Progress Notes (Signed)
Primary Physician/Referring:  Benito Mccreedy, MD  Patient ID: Joshua Barker, male    DOB: 04-May-1961, 58 y.o.   MRN: [id number]  Chief Complaint  Patient presents with  . Hypertension  . Follow-up    HPI: Joshua Barker  is a 58 y.o. male  with HLD, diabetes, and previously difficult to control hypertension. He underwent testing in Aug 2018, exercise nuclear stress testing that showed excellent exercise tolerance and previously noted lateral ischemia was no longer present. Echocardiogram was unchanged from previous test in 2015. He now presents for 1 year follow up for hypertension.  No new complaints today. He is exercising regularly without exertional difficulty. Blood pressure has been well controlled, also reports diabetes is much better controlled with last HgbA1c of around 6.8% Feb 2020.  Does report symptoms of ED that has been present for several years. Had no improvement with Cialis in the past.  Past Medical History:  Diagnosis Date  . DM (diabetes mellitus) ([place])   . Hyperlipidemia   . Hypertension   . Sleep apnea     Past Surgical History:  Procedure Laterality Date  . KNEE SURGERY      Social History   Socioeconomic History  . Marital status: Married    Spouse name: Not on file  . Number of children: 3  . Years of education: Not on file  . Highest education level: Not on file  Occupational History  . Not on file  Social Needs  . Financial resource strain: Not on file  . Food insecurity:    Worry: Not on file    Inability: Not on file  . Transportation needs:    Medical: Not on file    Non-medical: Not on file  Tobacco Use  . Smoking status: Current Some Day Smoker    Types: Cigars  . Smokeless tobacco: Never Used  . Tobacco comment: only smoke 2 cigars in a month  Substance and Sexual Activity  . Alcohol use: Yes    Comment: occasional use  . Drug use: No  . Sexual activity: Not on file  Lifestyle  . Physical activity:    Days per week: Not on  file    Minutes per session: Not on file  . Stress: Not on file  Relationships  . Social connections:    Talks on phone: Not on file    Gets together: Not on file    Attends religious service: Not on file    Active member of club or organization: Not on file    Attends meetings of clubs or organizations: Not on file    Relationship status: Not on file  . Intimate partner violence:    Fear of current or ex partner: Not on file    Emotionally abused: Not on file    Physically abused: Not on file    Forced sexual activity: Not on file  Other Topics Concern  . Not on file  Social History Narrative  . Not on file    Current Outpatient Medications on File Prior to Visit  Medication Sig Dispense Refill  . amLODipine (NORVASC) 10 MG tablet Take 10 mg by mouth daily.    . Azilsartan-Chlorthalidone 40-25 MG TABS Take 1 tablet by mouth daily.    . diclofenac (VOLTAREN) 75 MG EC tablet Take 75 mg by mouth 2 (two) times daily as needed for moderate pain (Arthritis).    Marland Kitchen glipiZIDE-metformin (METAGLIP) 5-500 MG per tablet Take 1 tablet by mouth daily.    Marland Kitchen  Ibuprofen (ADVIL) 200 MG CAPS Take 2 capsules by mouth 3 (three) times daily as needed (Joint pain).    . insulin aspart protamine - aspart (NOVOLOG 70/30 MIX) (70-30) 100 UNIT/ML FlexPen Inject into the skin 2 (two) times daily.    . Nebivolol HCl (BYSTOLIC) 20 MG TABS Take 1 tablet by mouth daily.     . rosuvastatin (CRESTOR) 40 MG tablet Take 40 mg by mouth daily.    . TURMERIC PO Take by mouth.    Marland Kitchen aspirin 81 MG tablet Take 81 mg by mouth daily.     No current facility-administered medications on file prior to visit.     Review of Systems  Constitution: Negative for chills, decreased appetite, malaise/fatigue and weight gain.  Cardiovascular: Negative for dyspnea on exertion, leg swelling and syncope.  Endocrine: Negative for cold intolerance.  Hematologic/Lymphatic: Does not bruise/bleed easily.  Musculoskeletal: Positive for  joint pain (foot pain since injury and also bilateral knee arthritis). Negative for joint swelling.  Gastrointestinal: Negative for abdominal pain, anorexia and change in bowel habit.  Neurological: Negative for headaches and light-headedness.  Psychiatric/Behavioral: Negative for depression and substance abuse.  All other systems reviewed and are negative.     Objective  Blood pressure 140/82, pulse 64, height _0  (1.88 m), weight 228 lb (103.4 kg). Body mass index is 29.27 kg/m.  Physical exam not performed or limited due to virtual visit. Please see exam details from prior visit is as below.     Physical Exam  Constitutional: He appears well-developed and well-nourished. No distress.  HENT:  Head: Atraumatic.  Eyes: Conjunctivae are normal.  Neck: Neck supple. No JVD present. No thyromegaly present.  Cardiovascular: Normal rate, regular rhythm, normal heart sounds and intact distal pulses. Exam reveals no gallop.  No murmur heard. Pulmonary/Chest: Effort normal and breath sounds normal.  Abdominal: Soft. Bowel sounds are normal.  Musculoskeletal: Normal range of motion.        General: No edema.  Neurological: He is alert.  Skin: Skin is warm and dry.  Psychiatric: He has a normal mood and affect.   Radiology: No results found.  Laboratory examination:    Labs 05/02/2017: A1c 7.3%, serum glucose 85, BUN 32, creatinine 1.76, eGFR 49 mL, potassium 4.4.  05/18/2016: HbA1c 7.4%, glucose 132, BUN 30, creatinine 1.43, eGFR 63, potassium 4.2, CMP otherwise normal, total cholesterol 143, triglycerides 95, HDL 40, LDL 84  No flowsheet data found. No flowsheet data found. Lipid Panel  No results found for: CHOL, TRIG, HDL, CHOLHDL, VLDL, LDLCALC, LDLDIRECT HEMOGLOBIN A1C No results found for: HGBA1C, MPG TSH No results for input(s): TSH in the last 8760 hours.  Cardiac Studies:   Echocardiogram [07/23/2017]: Left ventricle cavity is normal in size. Severe concentric  remodeling of the left ventricle. Normal global wall motion. Normal diastolic filling pattern, normal LAP. Calculated EF 67%. Left atrial cavity is moderately dilated at 4.5 cm. Mild tricuspid regurgitation. No evidence of pulmonary hypertension. Compared to the study done on 01/28/2014, no significant changes noted  Nuclear stress test [07/15/2017]: 1. The resting electrocardiogram demonstrated normal sinus rhythm, RBBB, no resting arrhythmias and diffuse T deep T inversion. The stress electrocardiogram was normal. Patient exercised on Bruce protocol for 11.00 minutes and achieved 11.92 METS. Stress test terminated due to fatigue and 85% MPHR achieved (Target HR >85%). Hypertensive BP with peak BP 252/62 mm Hg. 2. Myocardial perfusion imaging is normal. Overall left ventricular systolic function was normal without regional wall motion abnormalities. The  left ventricular ejection fraction was 56%. Compared to 03/09/2011, lateral ischemia not present.  Assessment   Hypertensive heart disease without heart failure  Hypercholesteremia  Controlled type 2 diabetes mellitus with stage 3 chronic kidney disease, with long-term current use of insulin (HCC)  Post-traumatic osteoarthritis of both feet - Plan: traMADol (ULTRAM) 50 MG tablet, DISCONTINUED: traMADol (ULTRAM) 50 MG tablet  EKG 03/24/2018: Normal sinus rhythm/sinus bradycardia at a rate of 60 bpm, left atrial enlargement, right bundle branch block. Deep T wave inversion in inferolateral leads, consider hypertrophic cardiomyopathy. No change from EKG 07/04/2017.  Recommendations:   Patient is to be doing well, diabetes is improved, his weight has stabilized.  He is now newly married for the 2nd time.  Recently with a past 2 months he has noticed elevated blood pressure which I really believe is due to him eating pork rinds and also has been taking Advil 200 mg 60 tablets a day for joint pain especially with knee and ankle pain.  I have advised him  not to take NSAIDs regularly, I'll prescribe him Ultram for his joint pains and he will discuss with his PCP regarding management of his arthritis and refills.  Otherwise he did not make any changes to his medications.  He states that his diabetes is well controlled last hemoglobin A1c being 6.8% in February 2020, lipids are also very well controlled.  From cardiac standpoint he seems to be doing well, I'll see him back on a p.r.n. basis.  Adrian Prows, MD, [facility] 03/26/2019, 10:18 AM [facility] [place] Pager: [phone] Office: [phone] If no answer Cell [phone]

## 2019-03-26 ENCOUNTER — Other Ambulatory Visit: Payer: Self-pay

## 2019-03-26 ENCOUNTER — Encounter: Payer: Self-pay | Admitting: Cardiology

## 2019-03-26 ENCOUNTER — Ambulatory Visit (INDEPENDENT_AMBULATORY_CARE_PROVIDER_SITE_OTHER): Payer: BLUE CROSS/BLUE SHIELD | Admitting: Cardiology

## 2019-03-26 VITALS — BP 140/82 | HR 64 | Ht 74.0 in | Wt 228.0 lb

## 2019-03-26 DIAGNOSIS — N183 Chronic kidney disease, stage 3 unspecified: Secondary | ICD-10-CM

## 2019-03-26 DIAGNOSIS — E7800 Pure hypercholesterolemia, unspecified: Secondary | ICD-10-CM

## 2019-03-26 DIAGNOSIS — M19172 Post-traumatic osteoarthritis, left ankle and foot: Secondary | ICD-10-CM

## 2019-03-26 DIAGNOSIS — I119 Hypertensive heart disease without heart failure: Secondary | ICD-10-CM

## 2019-03-26 DIAGNOSIS — M19171 Post-traumatic osteoarthritis, right ankle and foot: Secondary | ICD-10-CM | POA: Diagnosis not present

## 2019-03-26 DIAGNOSIS — E1122 Type 2 diabetes mellitus with diabetic chronic kidney disease: Secondary | ICD-10-CM | POA: Diagnosis not present

## 2019-03-26 DIAGNOSIS — Z794 Long term (current) use of insulin: Secondary | ICD-10-CM

## 2019-03-26 MED ORDER — TRAMADOL HCL 50 MG PO TABS: 50.0000 mg | ORAL_TABLET | Freq: Four times a day (QID) | ORAL | 0 refills | Status: DC | PRN

## 2019-04-24 DIAGNOSIS — B360 Pityriasis versicolor: Secondary | ICD-10-CM | POA: Diagnosis not present

## 2019-04-27 ENCOUNTER — Other Ambulatory Visit: Payer: Self-pay | Admitting: Cardiology

## 2019-04-27 DIAGNOSIS — M19171 Post-traumatic osteoarthritis, right ankle and foot: Secondary | ICD-10-CM

## 2019-04-27 DIAGNOSIS — M19172 Post-traumatic osteoarthritis, left ankle and foot: Secondary | ICD-10-CM

## 2019-06-24 ENCOUNTER — Other Ambulatory Visit: Payer: Self-pay

## 2019-06-24 MED ORDER — ROSUVASTATIN CALCIUM 40 MG PO TABS: 40.0000 mg | ORAL_TABLET | Freq: Every day | ORAL | 0 refills | Status: DC

## 2019-08-18 ENCOUNTER — Telehealth: Payer: Self-pay

## 2019-08-18 MED ORDER — AMLODIPINE BESYLATE 10 MG PO TABS: 10.0000 mg | ORAL_TABLET | Freq: Every day | ORAL | 0 refills | Status: DC

## 2019-08-28 ENCOUNTER — Other Ambulatory Visit: Payer: Self-pay

## 2019-08-28 DIAGNOSIS — I10 Essential (primary) hypertension: Secondary | ICD-10-CM

## 2019-08-28 MED ORDER — BYSTOLIC 20 MG PO TABS: 1.0000 | ORAL_TABLET | Freq: Every day | ORAL | 3 refills | Status: DC

## 2019-09-15 NOTE — Telephone Encounter (Signed)
error 

## 2019-09-19 ENCOUNTER — Other Ambulatory Visit: Payer: Self-pay | Admitting: Cardiology

## 2019-10-27 DIAGNOSIS — M25522 Pain in left elbow: Secondary | ICD-10-CM | POA: Diagnosis not present

## 2019-11-03 DIAGNOSIS — E1122 Type 2 diabetes mellitus with diabetic chronic kidney disease: Secondary | ICD-10-CM | POA: Diagnosis not present

## 2019-11-03 DIAGNOSIS — Z720 Tobacco use: Secondary | ICD-10-CM | POA: Diagnosis not present

## 2019-11-03 DIAGNOSIS — I10 Essential (primary) hypertension: Secondary | ICD-10-CM | POA: Diagnosis not present

## 2019-11-03 DIAGNOSIS — E559 Vitamin D deficiency, unspecified: Secondary | ICD-10-CM | POA: Diagnosis not present

## 2019-11-20 ENCOUNTER — Other Ambulatory Visit: Payer: Self-pay | Admitting: Cardiology

## 2019-11-23 ENCOUNTER — Other Ambulatory Visit: Payer: Self-pay | Admitting: Cardiology

## 2019-12-01 ENCOUNTER — Other Ambulatory Visit: Payer: Self-pay

## 2019-12-01 MED ORDER — ROSUVASTATIN CALCIUM 40 MG PO TABS: 40.0000 mg | ORAL_TABLET | Freq: Every day | ORAL | 2 refills | Status: DC

## 2019-12-02 DIAGNOSIS — Z20828 Contact with and (suspected) exposure to other viral communicable diseases: Secondary | ICD-10-CM | POA: Diagnosis not present

## 2019-12-04 ENCOUNTER — Other Ambulatory Visit: Payer: Self-pay

## 2019-12-04 DIAGNOSIS — I10 Essential (primary) hypertension: Secondary | ICD-10-CM

## 2019-12-04 MED ORDER — BYSTOLIC 20 MG PO TABS: 1.0000 | ORAL_TABLET | Freq: Every day | ORAL | 1 refills | Status: DC

## 2019-12-09 ENCOUNTER — Other Ambulatory Visit: Payer: Self-pay

## 2019-12-09 DIAGNOSIS — I10 Essential (primary) hypertension: Secondary | ICD-10-CM

## 2019-12-09 MED ORDER — ROSUVASTATIN CALCIUM 40 MG PO TABS: 40.0000 mg | ORAL_TABLET | Freq: Every day | ORAL | 2 refills | Status: DC

## 2019-12-09 MED ORDER — AMLODIPINE BESYLATE 10 MG PO TABS: 10.0000 mg | ORAL_TABLET | Freq: Every day | ORAL | 0 refills | Status: DC

## 2019-12-09 MED ORDER — BYSTOLIC 20 MG PO TABS: 1.0000 | ORAL_TABLET | Freq: Every day | ORAL | 1 refills | Status: DC

## 2019-12-17 ENCOUNTER — Other Ambulatory Visit: Payer: Self-pay

## 2019-12-17 DIAGNOSIS — I10 Essential (primary) hypertension: Secondary | ICD-10-CM

## 2020-03-29 ENCOUNTER — Other Ambulatory Visit: Payer: Self-pay | Admitting: Cardiology

## 2020-04-07 ENCOUNTER — Other Ambulatory Visit: Payer: Self-pay

## 2020-04-07 MED ORDER — NEBIVOLOL HCL 10 MG PO TABS: 10.0000 mg | ORAL_TABLET | Freq: Every day | ORAL | 0 refills | Status: DC

## 2020-04-27 DIAGNOSIS — E1122 Type 2 diabetes mellitus with diabetic chronic kidney disease: Secondary | ICD-10-CM | POA: Diagnosis not present

## 2020-04-27 DIAGNOSIS — E782 Mixed hyperlipidemia: Secondary | ICD-10-CM | POA: Diagnosis not present

## 2020-04-27 DIAGNOSIS — Z0001 Encounter for general adult medical examination with abnormal findings: Secondary | ICD-10-CM | POA: Diagnosis not present

## 2020-04-27 DIAGNOSIS — Z136 Encounter for screening for cardiovascular disorders: Secondary | ICD-10-CM | POA: Diagnosis not present

## 2020-04-27 DIAGNOSIS — Z125 Encounter for screening for malignant neoplasm of prostate: Secondary | ICD-10-CM | POA: Diagnosis not present

## 2020-04-27 DIAGNOSIS — Z01118 Encounter for examination of ears and hearing with other abnormal findings: Secondary | ICD-10-CM | POA: Diagnosis not present

## 2020-04-27 DIAGNOSIS — Z720 Tobacco use: Secondary | ICD-10-CM | POA: Diagnosis not present

## 2020-04-27 DIAGNOSIS — Z1329 Encounter for screening for other suspected endocrine disorder: Secondary | ICD-10-CM | POA: Diagnosis not present

## 2020-04-27 DIAGNOSIS — E559 Vitamin D deficiency, unspecified: Secondary | ICD-10-CM | POA: Diagnosis not present

## 2020-04-27 DIAGNOSIS — I10 Essential (primary) hypertension: Secondary | ICD-10-CM | POA: Diagnosis not present

## 2020-04-30 ENCOUNTER — Other Ambulatory Visit: Payer: Self-pay | Admitting: Cardiology

## 2020-05-19 ENCOUNTER — Other Ambulatory Visit: Payer: Self-pay

## 2020-05-19 MED ORDER — CANDESARTAN CILEXETIL-HCTZ 16-12.5 MG PO TABS: 1.0000 | ORAL_TABLET | Freq: Every day | ORAL | 3 refills | Status: DC

## 2020-06-02 DIAGNOSIS — Z1212 Encounter for screening for malignant neoplasm of rectum: Secondary | ICD-10-CM | POA: Diagnosis not present

## 2020-06-02 DIAGNOSIS — Z1211 Encounter for screening for malignant neoplasm of colon: Secondary | ICD-10-CM | POA: Diagnosis not present

## 2020-06-08 LAB — EXTERNAL GENERIC LAB PROCEDURE: COLOGUARD: NEGATIVE

## 2020-06-18 ENCOUNTER — Other Ambulatory Visit: Payer: Self-pay | Admitting: Cardiology

## 2020-06-23 DIAGNOSIS — M1711 Unilateral primary osteoarthritis, right knee: Secondary | ICD-10-CM | POA: Diagnosis not present

## 2020-06-23 DIAGNOSIS — M769 Unspecified enthesopathy, lower limb, excluding foot: Secondary | ICD-10-CM | POA: Diagnosis not present

## 2020-06-23 DIAGNOSIS — M25561 Pain in right knee: Secondary | ICD-10-CM | POA: Diagnosis not present

## 2020-06-23 DIAGNOSIS — M85861 Other specified disorders of bone density and structure, right lower leg: Secondary | ICD-10-CM | POA: Diagnosis not present

## 2020-06-23 DIAGNOSIS — M25461 Effusion, right knee: Secondary | ICD-10-CM | POA: Diagnosis not present

## 2020-06-23 DIAGNOSIS — M25861 Other specified joint disorders, right knee: Secondary | ICD-10-CM | POA: Diagnosis not present

## 2020-07-29 ENCOUNTER — Other Ambulatory Visit: Payer: Self-pay | Admitting: Cardiology

## 2020-08-17 DIAGNOSIS — E1122 Type 2 diabetes mellitus with diabetic chronic kidney disease: Secondary | ICD-10-CM | POA: Diagnosis not present

## 2020-08-17 DIAGNOSIS — N1832 Chronic kidney disease, stage 3b: Secondary | ICD-10-CM | POA: Diagnosis not present

## 2020-08-17 DIAGNOSIS — E1142 Type 2 diabetes mellitus with diabetic polyneuropathy: Secondary | ICD-10-CM | POA: Diagnosis not present

## 2020-08-17 DIAGNOSIS — E782 Mixed hyperlipidemia: Secondary | ICD-10-CM | POA: Diagnosis not present

## 2020-08-17 DIAGNOSIS — I10 Essential (primary) hypertension: Secondary | ICD-10-CM | POA: Diagnosis not present

## 2020-08-25 ENCOUNTER — Other Ambulatory Visit: Payer: Self-pay | Admitting: Cardiology

## 2020-09-21 DIAGNOSIS — M1711 Unilateral primary osteoarthritis, right knee: Secondary | ICD-10-CM | POA: Diagnosis not present

## 2020-11-09 DIAGNOSIS — Z794 Long term (current) use of insulin: Secondary | ICD-10-CM | POA: Diagnosis not present

## 2020-11-09 DIAGNOSIS — I10 Essential (primary) hypertension: Secondary | ICD-10-CM | POA: Diagnosis not present

## 2020-11-09 DIAGNOSIS — Z0000 Encounter for general adult medical examination without abnormal findings: Secondary | ICD-10-CM | POA: Diagnosis not present

## 2020-11-09 DIAGNOSIS — E119 Type 2 diabetes mellitus without complications: Secondary | ICD-10-CM | POA: Diagnosis not present

## 2020-12-13 ENCOUNTER — Other Ambulatory Visit: Payer: Self-pay | Admitting: Cardiology

## 2021-01-04 ENCOUNTER — Other Ambulatory Visit: Payer: Self-pay | Admitting: Cardiology

## 2021-01-04 DIAGNOSIS — I10 Essential (primary) hypertension: Secondary | ICD-10-CM

## 2021-01-17 ENCOUNTER — Other Ambulatory Visit: Payer: Self-pay

## 2021-01-18 ENCOUNTER — Other Ambulatory Visit: Payer: Self-pay

## 2021-01-18 MED ORDER — CANDESARTAN CILEXETIL-HCTZ 16-12.5 MG PO TABS: 1.0000 | ORAL_TABLET | Freq: Every day | ORAL | 3 refills | Status: AC

## 2021-01-20 DIAGNOSIS — E119 Type 2 diabetes mellitus without complications: Secondary | ICD-10-CM | POA: Diagnosis not present

## 2021-01-20 DIAGNOSIS — Z0000 Encounter for general adult medical examination without abnormal findings: Secondary | ICD-10-CM | POA: Diagnosis not present

## 2021-01-20 DIAGNOSIS — I10 Essential (primary) hypertension: Secondary | ICD-10-CM | POA: Diagnosis not present

## 2021-01-20 DIAGNOSIS — E782 Mixed hyperlipidemia: Secondary | ICD-10-CM | POA: Diagnosis not present

## 2021-01-20 DIAGNOSIS — Z794 Long term (current) use of insulin: Secondary | ICD-10-CM | POA: Diagnosis not present

## 2021-01-20 DIAGNOSIS — Z23 Encounter for immunization: Secondary | ICD-10-CM | POA: Diagnosis not present

## 2021-07-27 DIAGNOSIS — N1831 Chronic kidney disease, stage 3a: Secondary | ICD-10-CM | POA: Diagnosis not present

## 2021-07-27 DIAGNOSIS — Z23 Encounter for immunization: Secondary | ICD-10-CM | POA: Diagnosis not present

## 2021-07-27 DIAGNOSIS — I129 Hypertensive chronic kidney disease with stage 1 through stage 4 chronic kidney disease, or unspecified chronic kidney disease: Secondary | ICD-10-CM | POA: Diagnosis not present

## 2021-07-27 DIAGNOSIS — Z794 Long term (current) use of insulin: Secondary | ICD-10-CM | POA: Diagnosis not present

## 2021-07-27 DIAGNOSIS — E1122 Type 2 diabetes mellitus with diabetic chronic kidney disease: Secondary | ICD-10-CM | POA: Diagnosis not present

## 2021-07-27 DIAGNOSIS — I10 Essential (primary) hypertension: Secondary | ICD-10-CM | POA: Diagnosis not present

## 2021-07-27 DIAGNOSIS — M545 Low back pain, unspecified: Secondary | ICD-10-CM | POA: Diagnosis not present

## 2021-07-27 DIAGNOSIS — E119 Type 2 diabetes mellitus without complications: Secondary | ICD-10-CM | POA: Diagnosis not present

## 2021-08-04 DIAGNOSIS — M25532 Pain in left wrist: Secondary | ICD-10-CM | POA: Diagnosis not present

## 2021-08-04 DIAGNOSIS — M7989 Other specified soft tissue disorders: Secondary | ICD-10-CM | POA: Diagnosis not present

## 2021-09-19 DIAGNOSIS — Z23 Encounter for immunization: Secondary | ICD-10-CM | POA: Diagnosis not present

## 2021-09-19 DIAGNOSIS — I10 Essential (primary) hypertension: Secondary | ICD-10-CM | POA: Diagnosis not present

## 2021-09-19 DIAGNOSIS — M1A9XX0 Chronic gout, unspecified, without tophus (tophi): Secondary | ICD-10-CM | POA: Diagnosis not present

## 2021-09-19 DIAGNOSIS — J329 Chronic sinusitis, unspecified: Secondary | ICD-10-CM | POA: Diagnosis not present

## 2021-09-19 DIAGNOSIS — H109 Unspecified conjunctivitis: Secondary | ICD-10-CM | POA: Diagnosis not present

## 2021-10-25 ENCOUNTER — Other Ambulatory Visit: Payer: Self-pay | Admitting: Cardiology

## 2022-02-22 ENCOUNTER — Other Ambulatory Visit: Payer: Self-pay | Admitting: Cardiology

## 2022-09-18 ENCOUNTER — Other Ambulatory Visit: Payer: Self-pay | Admitting: Cardiology

## 2022-12-01 ENCOUNTER — Other Ambulatory Visit: Payer: Self-pay | Admitting: Cardiology

## 2022-12-17 ENCOUNTER — Other Ambulatory Visit: Payer: Self-pay | Admitting: Cardiology

## 2023-10-23 ENCOUNTER — Other Ambulatory Visit: Payer: Self-pay

## 2023-10-23 MED ORDER — AMLODIPINE BESYLATE 10 MG PO TABS: 10.0000 mg | ORAL_TABLET | Freq: Every day | ORAL | 0 refills | Status: DC

## 2023-12-10 ENCOUNTER — Telehealth: Payer: Self-pay

## 2023-12-10 NOTE — Telephone Encounter (Signed)
 Refill request brought to triage for the pts Amlodipine ... I called the pt since he has not been seen by Dr Ladona since 2020... he says he was not sure of he had to come back and see Cardiology since he has been doing well.... he will talk with his PCP about future refills and if he needs to come back to us  or if Osei-Bonsu, Zachary, MDPCP - General... can mange his care. I advised we can get him back in but he says he will let us  know.

## 2023-12-10 NOTE — Telephone Encounter (Signed)
 This pt was last seen in 2020. Does Dr. Jacinto Halim want to refill or does he need to set an appt as a new Pt? Please advise.

## 2023-12-24 ENCOUNTER — Other Ambulatory Visit: Payer: Self-pay | Admitting: Cardiology

## 2023-12-25 NOTE — Telephone Encounter (Signed)
Please send to PCP .  Patient is seeing Dr Jacinto Halim as needed

## 2023-12-25 NOTE — Telephone Encounter (Signed)
This Pt of Dr. Jacinto Halim has not been seen since 2020. Does Dr. Jacinto Halim want to refill? Please advise.

## 2024-01-19 ENCOUNTER — Other Ambulatory Visit: Payer: Self-pay | Admitting: Cardiology

## 2024-02-17 ENCOUNTER — Other Ambulatory Visit: Payer: Self-pay

## 2024-02-17 MED ORDER — AMLODIPINE BESYLATE 10 MG PO TABS: 10.0000 mg | ORAL_TABLET | Freq: Every day | ORAL | 0 refills | Status: AC
